# Patient Record
Sex: Female | Born: 1945
Health system: Southern US, Community
[De-identification: ages and names within clinical notes are randomized; demographics above are authoritative.]

---

## 2004-04-24 ENCOUNTER — Encounter: Admission: RE | Admit: 2004-04-24 | Discharge: 2004-04-24 | Payer: Self-pay | Admitting: Unknown Physician Specialty

## 2005-11-12 ENCOUNTER — Encounter: Admission: RE | Admit: 2005-11-12 | Discharge: 2005-11-12 | Payer: Self-pay | Admitting: Unknown Physician Specialty

## 2006-12-01 ENCOUNTER — Encounter: Admission: RE | Admit: 2006-12-01 | Discharge: 2006-12-01 | Payer: Self-pay | Admitting: Radiology

## 2006-12-06 ENCOUNTER — Encounter: Admission: RE | Admit: 2006-12-06 | Discharge: 2006-12-06 | Payer: Self-pay | Admitting: Unknown Physician Specialty

## 2008-07-24 ENCOUNTER — Encounter: Admission: RE | Admit: 2008-07-24 | Discharge: 2008-07-24 | Payer: Self-pay | Admitting: Unknown Physician Specialty

## 2009-12-19 ENCOUNTER — Encounter: Admission: RE | Admit: 2009-12-19 | Discharge: 2009-12-19 | Payer: Self-pay | Admitting: Internal Medicine

## 2009-12-23 ENCOUNTER — Encounter: Admission: RE | Admit: 2009-12-23 | Discharge: 2009-12-23 | Payer: Self-pay | Admitting: Unknown Physician Specialty

## 2010-02-15 ENCOUNTER — Encounter: Payer: Self-pay | Admitting: Unknown Physician Specialty

## 2010-03-27 ENCOUNTER — Other Ambulatory Visit: Payer: Self-pay | Admitting: Radiology

## 2010-03-27 DIAGNOSIS — Z0000 Encounter for general adult medical examination without abnormal findings: Secondary | ICD-10-CM

## 2010-05-19 ENCOUNTER — Other Ambulatory Visit: Payer: Self-pay | Admitting: Radiology

## 2010-05-19 DIAGNOSIS — Z0000 Encounter for general adult medical examination without abnormal findings: Secondary | ICD-10-CM

## 2010-05-20 ENCOUNTER — Other Ambulatory Visit: Payer: Self-pay | Admitting: Radiology

## 2010-05-20 DIAGNOSIS — Z0000 Encounter for general adult medical examination without abnormal findings: Secondary | ICD-10-CM

## 2010-11-18 ENCOUNTER — Other Ambulatory Visit: Payer: Self-pay | Admitting: Radiology

## 2010-11-18 DIAGNOSIS — Z1231 Encounter for screening mammogram for malignant neoplasm of breast: Secondary | ICD-10-CM

## 2011-01-29 ENCOUNTER — Other Ambulatory Visit: Payer: Self-pay | Admitting: Radiology

## 2011-01-29 DIAGNOSIS — R1030 Lower abdominal pain, unspecified: Secondary | ICD-10-CM

## 2011-01-29 DIAGNOSIS — C52 Malignant neoplasm of vagina: Secondary | ICD-10-CM

## 2011-02-04 ENCOUNTER — Ambulatory Visit
Admission: RE | Admit: 2011-02-04 | Discharge: 2011-02-04 | Disposition: A | Discharge status: Home / Self Care | Payer: PRIVATE HEALTH INSURANCE | Source: Ambulatory Visit | Attending: Radiology | Admitting: Radiology

## 2011-02-04 DIAGNOSIS — C52 Malignant neoplasm of vagina: Secondary | ICD-10-CM

## 2011-02-04 DIAGNOSIS — R1030 Lower abdominal pain, unspecified: Secondary | ICD-10-CM

## 2011-02-04 MED ORDER — IOHEXOL 300 MG/ML  SOLN
100.0000 mL | Freq: Once | INTRAMUSCULAR | Status: AC | PRN
  Administered 2011-02-04: 100 mL via INTRAVENOUS

## 2011-02-05 ENCOUNTER — Other Ambulatory Visit: Payer: Self-pay

## 2011-05-03 ENCOUNTER — Other Ambulatory Visit: Payer: Self-pay | Admitting: Radiology

## 2011-05-03 DIAGNOSIS — Z1231 Encounter for screening mammogram for malignant neoplasm of breast: Secondary | ICD-10-CM

## 2011-06-29 ENCOUNTER — Ambulatory Visit
Admission: RE | Admit: 2011-06-29 | Discharge: 2011-06-29 | Disposition: A | Discharge status: Home / Self Care | Payer: PRIVATE HEALTH INSURANCE | Source: Ambulatory Visit | Attending: Radiology | Admitting: Radiology

## 2011-06-29 DIAGNOSIS — Z1231 Encounter for screening mammogram for malignant neoplasm of breast: Secondary | ICD-10-CM

## 2011-07-02 ENCOUNTER — Other Ambulatory Visit: Payer: Self-pay | Admitting: Radiology

## 2011-07-02 ENCOUNTER — Ambulatory Visit
Admission: RE | Admit: 2011-07-02 | Discharge: 2011-07-02 | Disposition: A | Discharge status: Home / Self Care | Payer: PRIVATE HEALTH INSURANCE | Source: Ambulatory Visit | Attending: Radiology | Admitting: Radiology

## 2011-07-02 DIAGNOSIS — R928 Other abnormal and inconclusive findings on diagnostic imaging of breast: Secondary | ICD-10-CM

## 2011-10-27 ENCOUNTER — Other Ambulatory Visit: Payer: Self-pay | Admitting: Dermatology

## 2012-01-25 ENCOUNTER — Other Ambulatory Visit: Payer: Self-pay | Admitting: Radiology

## 2012-01-25 DIAGNOSIS — N63 Unspecified lump in unspecified breast: Secondary | ICD-10-CM

## 2012-01-31 ENCOUNTER — Ambulatory Visit
Admission: RE | Admit: 2012-01-31 | Discharge: 2012-01-31 | Disposition: A | Discharge status: Home / Self Care | Payer: PRIVATE HEALTH INSURANCE | Source: Ambulatory Visit | Attending: Radiology | Admitting: Radiology

## 2012-01-31 DIAGNOSIS — N63 Unspecified lump in unspecified breast: Secondary | ICD-10-CM

## 2012-08-09 ENCOUNTER — Ambulatory Visit
Admission: RE | Admit: 2012-08-09 | Discharge: 2012-08-09 | Disposition: A | Discharge status: Home / Self Care | Payer: Medicare Other | Source: Ambulatory Visit | Attending: Radiology | Admitting: Radiology

## 2012-08-09 ENCOUNTER — Other Ambulatory Visit: Payer: Self-pay | Admitting: Radiology

## 2012-08-09 DIAGNOSIS — N63 Unspecified lump in unspecified breast: Secondary | ICD-10-CM

## 2013-10-19 ENCOUNTER — Other Ambulatory Visit: Payer: Self-pay

## 2013-10-19 ENCOUNTER — Encounter (INDEPENDENT_AMBULATORY_CARE_PROVIDER_SITE_OTHER): Payer: Self-pay

## 2013-10-19 ENCOUNTER — Ambulatory Visit
Admission: RE | Admit: 2013-10-19 | Discharge: 2013-10-19 | Disposition: A | Discharge status: Home / Self Care | Payer: Medicare HMO | Source: Ambulatory Visit

## 2013-10-19 DIAGNOSIS — Z1231 Encounter for screening mammogram for malignant neoplasm of breast: Secondary | ICD-10-CM

## 2014-10-28 ENCOUNTER — Other Ambulatory Visit: Payer: Self-pay | Admitting: Internal Medicine

## 2014-10-28 DIAGNOSIS — Z1231 Encounter for screening mammogram for malignant neoplasm of breast: Secondary | ICD-10-CM

## 2015-01-28 DIAGNOSIS — I480 Paroxysmal atrial fibrillation: Secondary | ICD-10-CM | POA: Diagnosis not present

## 2015-01-28 DIAGNOSIS — Z7901 Long term (current) use of anticoagulants: Secondary | ICD-10-CM | POA: Diagnosis not present

## 2015-01-28 DIAGNOSIS — Z5181 Encounter for therapeutic drug level monitoring: Secondary | ICD-10-CM | POA: Diagnosis not present

## 2015-03-25 DIAGNOSIS — I480 Paroxysmal atrial fibrillation: Secondary | ICD-10-CM | POA: Diagnosis not present

## 2015-03-25 DIAGNOSIS — Z7901 Long term (current) use of anticoagulants: Secondary | ICD-10-CM | POA: Diagnosis not present

## 2015-03-25 DIAGNOSIS — R791 Abnormal coagulation profile: Secondary | ICD-10-CM | POA: Diagnosis not present

## 2015-03-25 DIAGNOSIS — Z5181 Encounter for therapeutic drug level monitoring: Secondary | ICD-10-CM | POA: Diagnosis not present

## 2015-04-02 DIAGNOSIS — H35032 Hypertensive retinopathy, left eye: Secondary | ICD-10-CM | POA: Diagnosis not present

## 2015-04-02 DIAGNOSIS — H35371 Puckering of macula, right eye: Secondary | ICD-10-CM | POA: Diagnosis not present

## 2015-04-02 DIAGNOSIS — I480 Paroxysmal atrial fibrillation: Secondary | ICD-10-CM | POA: Diagnosis not present

## 2015-04-02 DIAGNOSIS — H348312 Tributary (branch) retinal vein occlusion, right eye, stable: Secondary | ICD-10-CM | POA: Diagnosis not present

## 2015-04-02 DIAGNOSIS — Z5181 Encounter for therapeutic drug level monitoring: Secondary | ICD-10-CM | POA: Diagnosis not present

## 2015-04-02 DIAGNOSIS — H43813 Vitreous degeneration, bilateral: Secondary | ICD-10-CM | POA: Diagnosis not present

## 2015-04-02 DIAGNOSIS — Z7901 Long term (current) use of anticoagulants: Secondary | ICD-10-CM | POA: Diagnosis not present

## 2015-04-04 DIAGNOSIS — E785 Hyperlipidemia, unspecified: Secondary | ICD-10-CM | POA: Diagnosis not present

## 2015-04-04 DIAGNOSIS — I480 Paroxysmal atrial fibrillation: Secondary | ICD-10-CM | POA: Diagnosis not present

## 2015-04-04 DIAGNOSIS — I10 Essential (primary) hypertension: Secondary | ICD-10-CM | POA: Diagnosis not present

## 2015-04-11 ENCOUNTER — Other Ambulatory Visit: Payer: Self-pay | Admitting: Internal Medicine

## 2015-04-11 DIAGNOSIS — Z1231 Encounter for screening mammogram for malignant neoplasm of breast: Secondary | ICD-10-CM

## 2015-04-16 ENCOUNTER — Ambulatory Visit
Admission: RE | Admit: 2015-04-16 | Discharge: 2015-04-16 | Disposition: A | Discharge status: Home / Self Care | Payer: PPO | Source: Ambulatory Visit | Attending: Internal Medicine | Admitting: Internal Medicine

## 2015-04-16 DIAGNOSIS — Z1231 Encounter for screening mammogram for malignant neoplasm of breast: Secondary | ICD-10-CM | POA: Diagnosis not present

## 2015-05-08 DIAGNOSIS — Z5181 Encounter for therapeutic drug level monitoring: Secondary | ICD-10-CM | POA: Diagnosis not present

## 2015-05-08 DIAGNOSIS — I480 Paroxysmal atrial fibrillation: Secondary | ICD-10-CM | POA: Diagnosis not present

## 2015-05-08 DIAGNOSIS — R791 Abnormal coagulation profile: Secondary | ICD-10-CM | POA: Diagnosis not present

## 2015-05-08 DIAGNOSIS — Z7901 Long term (current) use of anticoagulants: Secondary | ICD-10-CM | POA: Diagnosis not present

## 2015-05-15 ENCOUNTER — Other Ambulatory Visit: Payer: Self-pay | Admitting: Internal Medicine

## 2015-05-16 ENCOUNTER — Other Ambulatory Visit: Payer: Self-pay | Admitting: Internal Medicine

## 2015-07-15 DIAGNOSIS — I480 Paroxysmal atrial fibrillation: Secondary | ICD-10-CM | POA: Diagnosis not present

## 2015-07-17 DIAGNOSIS — I480 Paroxysmal atrial fibrillation: Secondary | ICD-10-CM | POA: Diagnosis not present

## 2015-07-23 DIAGNOSIS — I10 Essential (primary) hypertension: Secondary | ICD-10-CM | POA: Diagnosis not present

## 2015-07-23 DIAGNOSIS — Z0000 Encounter for general adult medical examination without abnormal findings: Secondary | ICD-10-CM | POA: Diagnosis not present

## 2015-07-23 DIAGNOSIS — N6019 Diffuse cystic mastopathy of unspecified breast: Secondary | ICD-10-CM | POA: Diagnosis not present

## 2015-07-23 DIAGNOSIS — F439 Reaction to severe stress, unspecified: Secondary | ICD-10-CM | POA: Diagnosis not present

## 2015-07-23 DIAGNOSIS — Z119 Encounter for screening for infectious and parasitic diseases, unspecified: Secondary | ICD-10-CM | POA: Diagnosis not present

## 2015-07-23 DIAGNOSIS — R002 Palpitations: Secondary | ICD-10-CM | POA: Diagnosis not present

## 2015-07-23 DIAGNOSIS — E785 Hyperlipidemia, unspecified: Secondary | ICD-10-CM | POA: Diagnosis not present

## 2015-07-23 DIAGNOSIS — Z5181 Encounter for therapeutic drug level monitoring: Secondary | ICD-10-CM | POA: Diagnosis not present

## 2015-07-23 DIAGNOSIS — Z7901 Long term (current) use of anticoagulants: Secondary | ICD-10-CM | POA: Diagnosis not present

## 2015-07-23 DIAGNOSIS — I480 Paroxysmal atrial fibrillation: Secondary | ICD-10-CM | POA: Diagnosis not present

## 2015-07-23 DIAGNOSIS — E559 Vitamin D deficiency, unspecified: Secondary | ICD-10-CM | POA: Diagnosis not present

## 2015-07-23 DIAGNOSIS — M8580 Other specified disorders of bone density and structure, unspecified site: Secondary | ICD-10-CM | POA: Diagnosis not present

## 2015-08-04 DIAGNOSIS — R791 Abnormal coagulation profile: Secondary | ICD-10-CM | POA: Diagnosis not present

## 2015-08-04 DIAGNOSIS — I480 Paroxysmal atrial fibrillation: Secondary | ICD-10-CM | POA: Diagnosis not present

## 2015-08-04 DIAGNOSIS — Z5181 Encounter for therapeutic drug level monitoring: Secondary | ICD-10-CM | POA: Diagnosis not present

## 2015-08-04 DIAGNOSIS — Z7901 Long term (current) use of anticoagulants: Secondary | ICD-10-CM | POA: Diagnosis not present

## 2015-08-12 DIAGNOSIS — Z5181 Encounter for therapeutic drug level monitoring: Secondary | ICD-10-CM | POA: Diagnosis not present

## 2015-08-12 DIAGNOSIS — I480 Paroxysmal atrial fibrillation: Secondary | ICD-10-CM | POA: Diagnosis not present

## 2015-08-12 DIAGNOSIS — Z7901 Long term (current) use of anticoagulants: Secondary | ICD-10-CM | POA: Diagnosis not present

## 2015-10-22 DIAGNOSIS — H35032 Hypertensive retinopathy, left eye: Secondary | ICD-10-CM | POA: Diagnosis not present

## 2015-10-22 DIAGNOSIS — H348312 Tributary (branch) retinal vein occlusion, right eye, stable: Secondary | ICD-10-CM | POA: Diagnosis not present

## 2015-10-22 DIAGNOSIS — H43813 Vitreous degeneration, bilateral: Secondary | ICD-10-CM | POA: Diagnosis not present

## 2015-10-22 DIAGNOSIS — H35371 Puckering of macula, right eye: Secondary | ICD-10-CM | POA: Diagnosis not present

## 2015-11-05 DIAGNOSIS — Z5181 Encounter for therapeutic drug level monitoring: Secondary | ICD-10-CM | POA: Diagnosis not present

## 2015-11-05 DIAGNOSIS — E785 Hyperlipidemia, unspecified: Secondary | ICD-10-CM | POA: Diagnosis not present

## 2015-11-05 DIAGNOSIS — I480 Paroxysmal atrial fibrillation: Secondary | ICD-10-CM | POA: Diagnosis not present

## 2015-11-05 DIAGNOSIS — Z7901 Long term (current) use of anticoagulants: Secondary | ICD-10-CM | POA: Diagnosis not present

## 2015-11-05 DIAGNOSIS — R791 Abnormal coagulation profile: Secondary | ICD-10-CM | POA: Diagnosis not present

## 2015-11-05 DIAGNOSIS — I10 Essential (primary) hypertension: Secondary | ICD-10-CM | POA: Diagnosis not present

## 2015-12-01 DIAGNOSIS — Z5181 Encounter for therapeutic drug level monitoring: Secondary | ICD-10-CM | POA: Diagnosis not present

## 2015-12-01 DIAGNOSIS — I480 Paroxysmal atrial fibrillation: Secondary | ICD-10-CM | POA: Diagnosis not present

## 2015-12-01 DIAGNOSIS — Z7901 Long term (current) use of anticoagulants: Secondary | ICD-10-CM | POA: Diagnosis not present

## 2016-01-06 DIAGNOSIS — I480 Paroxysmal atrial fibrillation: Secondary | ICD-10-CM | POA: Diagnosis not present

## 2016-01-06 DIAGNOSIS — Z5181 Encounter for therapeutic drug level monitoring: Secondary | ICD-10-CM | POA: Diagnosis not present

## 2016-01-06 DIAGNOSIS — Z7901 Long term (current) use of anticoagulants: Secondary | ICD-10-CM | POA: Diagnosis not present

## 2016-02-02 DIAGNOSIS — Z7901 Long term (current) use of anticoagulants: Secondary | ICD-10-CM | POA: Diagnosis not present

## 2016-02-02 DIAGNOSIS — I480 Paroxysmal atrial fibrillation: Secondary | ICD-10-CM | POA: Diagnosis not present

## 2016-02-02 DIAGNOSIS — M8580 Other specified disorders of bone density and structure, unspecified site: Secondary | ICD-10-CM | POA: Diagnosis not present

## 2016-02-02 DIAGNOSIS — N6019 Diffuse cystic mastopathy of unspecified breast: Secondary | ICD-10-CM | POA: Diagnosis not present

## 2016-02-02 DIAGNOSIS — Z5181 Encounter for therapeutic drug level monitoring: Secondary | ICD-10-CM | POA: Diagnosis not present

## 2016-02-02 DIAGNOSIS — I10 Essential (primary) hypertension: Secondary | ICD-10-CM | POA: Diagnosis not present

## 2016-02-02 DIAGNOSIS — R002 Palpitations: Secondary | ICD-10-CM | POA: Diagnosis not present

## 2016-02-02 DIAGNOSIS — K5909 Other constipation: Secondary | ICD-10-CM | POA: Diagnosis not present

## 2016-02-02 DIAGNOSIS — G4700 Insomnia, unspecified: Secondary | ICD-10-CM | POA: Diagnosis not present

## 2016-02-02 DIAGNOSIS — R51 Headache: Secondary | ICD-10-CM | POA: Diagnosis not present

## 2016-02-02 DIAGNOSIS — F439 Reaction to severe stress, unspecified: Secondary | ICD-10-CM | POA: Diagnosis not present

## 2016-02-02 DIAGNOSIS — E785 Hyperlipidemia, unspecified: Secondary | ICD-10-CM | POA: Diagnosis not present

## 2016-02-02 DIAGNOSIS — N359 Urethral stricture, unspecified: Secondary | ICD-10-CM | POA: Diagnosis not present

## 2016-02-03 DIAGNOSIS — Z7901 Long term (current) use of anticoagulants: Secondary | ICD-10-CM | POA: Diagnosis not present

## 2016-02-03 DIAGNOSIS — Z5181 Encounter for therapeutic drug level monitoring: Secondary | ICD-10-CM | POA: Diagnosis not present

## 2016-02-03 DIAGNOSIS — I480 Paroxysmal atrial fibrillation: Secondary | ICD-10-CM | POA: Diagnosis not present

## 2016-03-02 DIAGNOSIS — I480 Paroxysmal atrial fibrillation: Secondary | ICD-10-CM | POA: Diagnosis not present

## 2016-03-02 DIAGNOSIS — Z7901 Long term (current) use of anticoagulants: Secondary | ICD-10-CM | POA: Diagnosis not present

## 2016-03-02 DIAGNOSIS — Z5181 Encounter for therapeutic drug level monitoring: Secondary | ICD-10-CM | POA: Diagnosis not present

## 2016-03-30 DIAGNOSIS — I480 Paroxysmal atrial fibrillation: Secondary | ICD-10-CM | POA: Diagnosis not present

## 2016-03-30 DIAGNOSIS — Z5181 Encounter for therapeutic drug level monitoring: Secondary | ICD-10-CM | POA: Diagnosis not present

## 2016-03-30 DIAGNOSIS — Z7901 Long term (current) use of anticoagulants: Secondary | ICD-10-CM | POA: Diagnosis not present

## 2016-04-27 DIAGNOSIS — Z7901 Long term (current) use of anticoagulants: Secondary | ICD-10-CM | POA: Diagnosis not present

## 2016-04-27 DIAGNOSIS — I480 Paroxysmal atrial fibrillation: Secondary | ICD-10-CM | POA: Diagnosis not present

## 2016-04-27 DIAGNOSIS — Z5181 Encounter for therapeutic drug level monitoring: Secondary | ICD-10-CM | POA: Diagnosis not present

## 2016-05-04 ENCOUNTER — Other Ambulatory Visit: Payer: Self-pay | Admitting: Internal Medicine

## 2016-05-04 DIAGNOSIS — Z1231 Encounter for screening mammogram for malignant neoplasm of breast: Secondary | ICD-10-CM

## 2016-05-05 ENCOUNTER — Ambulatory Visit
Admission: RE | Admit: 2016-05-05 | Discharge: 2016-05-05 | Disposition: A | Discharge status: Home / Self Care | Payer: PPO | Source: Ambulatory Visit | Attending: Internal Medicine | Admitting: Internal Medicine

## 2016-05-05 DIAGNOSIS — Z1231 Encounter for screening mammogram for malignant neoplasm of breast: Secondary | ICD-10-CM

## 2016-05-11 DIAGNOSIS — I10 Essential (primary) hypertension: Secondary | ICD-10-CM | POA: Diagnosis not present

## 2016-05-11 DIAGNOSIS — I480 Paroxysmal atrial fibrillation: Secondary | ICD-10-CM | POA: Diagnosis not present

## 2016-05-11 DIAGNOSIS — I4891 Unspecified atrial fibrillation: Secondary | ICD-10-CM | POA: Diagnosis not present

## 2016-07-07 DIAGNOSIS — Z7901 Long term (current) use of anticoagulants: Secondary | ICD-10-CM | POA: Diagnosis not present

## 2016-07-07 DIAGNOSIS — Z5181 Encounter for therapeutic drug level monitoring: Secondary | ICD-10-CM | POA: Diagnosis not present

## 2016-07-07 DIAGNOSIS — I480 Paroxysmal atrial fibrillation: Secondary | ICD-10-CM | POA: Diagnosis not present

## 2016-08-10 DIAGNOSIS — Z5181 Encounter for therapeutic drug level monitoring: Secondary | ICD-10-CM | POA: Diagnosis not present

## 2016-08-10 DIAGNOSIS — I480 Paroxysmal atrial fibrillation: Secondary | ICD-10-CM | POA: Diagnosis not present

## 2016-08-10 DIAGNOSIS — Z7901 Long term (current) use of anticoagulants: Secondary | ICD-10-CM | POA: Diagnosis not present

## 2016-09-21 DIAGNOSIS — Z5181 Encounter for therapeutic drug level monitoring: Secondary | ICD-10-CM | POA: Diagnosis not present

## 2016-09-21 DIAGNOSIS — I480 Paroxysmal atrial fibrillation: Secondary | ICD-10-CM | POA: Diagnosis not present

## 2016-09-21 DIAGNOSIS — Z7901 Long term (current) use of anticoagulants: Secondary | ICD-10-CM | POA: Diagnosis not present

## 2016-09-28 DIAGNOSIS — M25551 Pain in right hip: Secondary | ICD-10-CM | POA: Diagnosis not present

## 2016-09-28 DIAGNOSIS — M5136 Other intervertebral disc degeneration, lumbar region: Secondary | ICD-10-CM | POA: Diagnosis not present

## 2016-09-28 DIAGNOSIS — M549 Dorsalgia, unspecified: Secondary | ICD-10-CM | POA: Diagnosis not present

## 2016-09-28 DIAGNOSIS — E785 Hyperlipidemia, unspecified: Secondary | ICD-10-CM | POA: Diagnosis not present

## 2016-09-28 DIAGNOSIS — I10 Essential (primary) hypertension: Secondary | ICD-10-CM | POA: Diagnosis not present

## 2016-09-28 DIAGNOSIS — R51 Headache: Secondary | ICD-10-CM | POA: Diagnosis not present

## 2016-09-28 DIAGNOSIS — M47896 Other spondylosis, lumbar region: Secondary | ICD-10-CM | POA: Diagnosis not present

## 2016-10-12 DIAGNOSIS — R8299 Other abnormal findings in urine: Secondary | ICD-10-CM | POA: Diagnosis not present

## 2016-10-12 DIAGNOSIS — K5909 Other constipation: Secondary | ICD-10-CM | POA: Diagnosis not present

## 2016-10-12 DIAGNOSIS — E785 Hyperlipidemia, unspecified: Secondary | ICD-10-CM | POA: Diagnosis not present

## 2016-10-12 DIAGNOSIS — Z7901 Long term (current) use of anticoagulants: Secondary | ICD-10-CM | POA: Diagnosis not present

## 2016-10-12 DIAGNOSIS — N359 Urethral stricture, unspecified: Secondary | ICD-10-CM | POA: Diagnosis not present

## 2016-10-12 DIAGNOSIS — R5383 Other fatigue: Secondary | ICD-10-CM | POA: Diagnosis not present

## 2016-10-12 DIAGNOSIS — J209 Acute bronchitis, unspecified: Secondary | ICD-10-CM | POA: Diagnosis not present

## 2016-10-12 DIAGNOSIS — Z5181 Encounter for therapeutic drug level monitoring: Secondary | ICD-10-CM | POA: Diagnosis not present

## 2016-10-27 DIAGNOSIS — Z5181 Encounter for therapeutic drug level monitoring: Secondary | ICD-10-CM | POA: Diagnosis not present

## 2016-10-27 DIAGNOSIS — I480 Paroxysmal atrial fibrillation: Secondary | ICD-10-CM | POA: Diagnosis not present

## 2016-10-27 DIAGNOSIS — Z7901 Long term (current) use of anticoagulants: Secondary | ICD-10-CM | POA: Diagnosis not present

## 2016-11-11 DIAGNOSIS — Z5181 Encounter for therapeutic drug level monitoring: Secondary | ICD-10-CM | POA: Diagnosis not present

## 2016-11-11 DIAGNOSIS — Z7901 Long term (current) use of anticoagulants: Secondary | ICD-10-CM | POA: Diagnosis not present

## 2016-11-11 DIAGNOSIS — I480 Paroxysmal atrial fibrillation: Secondary | ICD-10-CM | POA: Diagnosis not present

## 2016-11-17 DIAGNOSIS — M489 Spondylopathy, unspecified: Secondary | ICD-10-CM | POA: Diagnosis not present

## 2016-11-17 DIAGNOSIS — K5909 Other constipation: Secondary | ICD-10-CM | POA: Diagnosis not present

## 2016-11-17 DIAGNOSIS — I10 Essential (primary) hypertension: Secondary | ICD-10-CM | POA: Diagnosis not present

## 2016-11-17 DIAGNOSIS — I480 Paroxysmal atrial fibrillation: Secondary | ICD-10-CM | POA: Diagnosis not present

## 2016-11-17 DIAGNOSIS — R002 Palpitations: Secondary | ICD-10-CM | POA: Diagnosis not present

## 2016-11-17 DIAGNOSIS — Z7901 Long term (current) use of anticoagulants: Secondary | ICD-10-CM | POA: Diagnosis not present

## 2016-11-17 DIAGNOSIS — Z5181 Encounter for therapeutic drug level monitoring: Secondary | ICD-10-CM | POA: Diagnosis not present

## 2016-11-17 DIAGNOSIS — F1320 Sedative, hypnotic or anxiolytic dependence, uncomplicated: Secondary | ICD-10-CM | POA: Diagnosis not present

## 2016-11-17 DIAGNOSIS — Q6431 Congenital bladder neck obstruction: Secondary | ICD-10-CM | POA: Diagnosis not present

## 2016-11-17 DIAGNOSIS — Z0000 Encounter for general adult medical examination without abnormal findings: Secondary | ICD-10-CM | POA: Diagnosis not present

## 2016-11-17 DIAGNOSIS — Z23 Encounter for immunization: Secondary | ICD-10-CM | POA: Diagnosis not present

## 2016-11-17 DIAGNOSIS — E785 Hyperlipidemia, unspecified: Secondary | ICD-10-CM | POA: Diagnosis not present

## 2017-02-15 DIAGNOSIS — Z7901 Long term (current) use of anticoagulants: Secondary | ICD-10-CM | POA: Diagnosis not present

## 2017-02-15 DIAGNOSIS — Z5181 Encounter for therapeutic drug level monitoring: Secondary | ICD-10-CM | POA: Diagnosis not present

## 2017-02-15 DIAGNOSIS — I480 Paroxysmal atrial fibrillation: Secondary | ICD-10-CM | POA: Diagnosis not present

## 2017-02-22 DIAGNOSIS — Z5181 Encounter for therapeutic drug level monitoring: Secondary | ICD-10-CM | POA: Diagnosis not present

## 2017-02-22 DIAGNOSIS — M489 Spondylopathy, unspecified: Secondary | ICD-10-CM | POA: Diagnosis not present

## 2017-02-22 DIAGNOSIS — Z1211 Encounter for screening for malignant neoplasm of colon: Secondary | ICD-10-CM | POA: Diagnosis not present

## 2017-02-22 DIAGNOSIS — R002 Palpitations: Secondary | ICD-10-CM | POA: Diagnosis not present

## 2017-02-22 DIAGNOSIS — G4700 Insomnia, unspecified: Secondary | ICD-10-CM | POA: Diagnosis not present

## 2017-02-22 DIAGNOSIS — F439 Reaction to severe stress, unspecified: Secondary | ICD-10-CM | POA: Diagnosis not present

## 2017-02-22 DIAGNOSIS — K5909 Other constipation: Secondary | ICD-10-CM | POA: Diagnosis not present

## 2017-02-22 DIAGNOSIS — I480 Paroxysmal atrial fibrillation: Secondary | ICD-10-CM | POA: Diagnosis not present

## 2017-02-22 DIAGNOSIS — Q6431 Congenital bladder neck obstruction: Secondary | ICD-10-CM | POA: Diagnosis not present

## 2017-02-22 DIAGNOSIS — F1320 Sedative, hypnotic or anxiolytic dependence, uncomplicated: Secondary | ICD-10-CM | POA: Diagnosis not present

## 2017-02-22 DIAGNOSIS — I10 Essential (primary) hypertension: Secondary | ICD-10-CM | POA: Diagnosis not present

## 2017-02-22 DIAGNOSIS — E785 Hyperlipidemia, unspecified: Secondary | ICD-10-CM | POA: Diagnosis not present

## 2017-03-22 DIAGNOSIS — I480 Paroxysmal atrial fibrillation: Secondary | ICD-10-CM | POA: Diagnosis not present

## 2017-03-22 DIAGNOSIS — Z7901 Long term (current) use of anticoagulants: Secondary | ICD-10-CM | POA: Diagnosis not present

## 2017-03-22 DIAGNOSIS — R791 Abnormal coagulation profile: Secondary | ICD-10-CM | POA: Diagnosis not present

## 2017-03-22 DIAGNOSIS — Z5181 Encounter for therapeutic drug level monitoring: Secondary | ICD-10-CM | POA: Diagnosis not present

## 2017-03-30 DIAGNOSIS — H527 Unspecified disorder of refraction: Secondary | ICD-10-CM | POA: Diagnosis not present

## 2017-03-30 DIAGNOSIS — H25012 Cortical age-related cataract, left eye: Secondary | ICD-10-CM | POA: Diagnosis not present

## 2017-04-06 DIAGNOSIS — H35341 Macular cyst, hole, or pseudohole, right eye: Secondary | ICD-10-CM | POA: Diagnosis not present

## 2017-04-06 DIAGNOSIS — H43392 Other vitreous opacities, left eye: Secondary | ICD-10-CM | POA: Diagnosis not present

## 2017-04-06 DIAGNOSIS — H25012 Cortical age-related cataract, left eye: Secondary | ICD-10-CM | POA: Diagnosis not present

## 2017-04-12 DIAGNOSIS — I480 Paroxysmal atrial fibrillation: Secondary | ICD-10-CM | POA: Diagnosis not present

## 2017-04-12 DIAGNOSIS — Z7901 Long term (current) use of anticoagulants: Secondary | ICD-10-CM | POA: Diagnosis not present

## 2017-04-12 DIAGNOSIS — Z5181 Encounter for therapeutic drug level monitoring: Secondary | ICD-10-CM | POA: Diagnosis not present

## 2017-04-13 DIAGNOSIS — H43392 Other vitreous opacities, left eye: Secondary | ICD-10-CM | POA: Diagnosis not present

## 2017-04-13 DIAGNOSIS — H35341 Macular cyst, hole, or pseudohole, right eye: Secondary | ICD-10-CM | POA: Diagnosis not present

## 2017-04-13 DIAGNOSIS — H25012 Cortical age-related cataract, left eye: Secondary | ICD-10-CM | POA: Diagnosis not present

## 2017-05-25 DIAGNOSIS — I480 Paroxysmal atrial fibrillation: Secondary | ICD-10-CM | POA: Diagnosis not present

## 2017-05-25 DIAGNOSIS — Z7901 Long term (current) use of anticoagulants: Secondary | ICD-10-CM | POA: Diagnosis not present

## 2017-05-25 DIAGNOSIS — R791 Abnormal coagulation profile: Secondary | ICD-10-CM | POA: Diagnosis not present

## 2017-05-25 DIAGNOSIS — Z5181 Encounter for therapeutic drug level monitoring: Secondary | ICD-10-CM | POA: Diagnosis not present

## 2017-06-22 DIAGNOSIS — I480 Paroxysmal atrial fibrillation: Secondary | ICD-10-CM | POA: Diagnosis not present

## 2017-06-22 DIAGNOSIS — Z5181 Encounter for therapeutic drug level monitoring: Secondary | ICD-10-CM | POA: Diagnosis not present

## 2017-06-22 DIAGNOSIS — R791 Abnormal coagulation profile: Secondary | ICD-10-CM | POA: Diagnosis not present

## 2017-06-22 DIAGNOSIS — Z7901 Long term (current) use of anticoagulants: Secondary | ICD-10-CM | POA: Diagnosis not present

## 2017-07-11 DIAGNOSIS — Z7901 Long term (current) use of anticoagulants: Secondary | ICD-10-CM | POA: Diagnosis not present

## 2017-07-11 DIAGNOSIS — Z5181 Encounter for therapeutic drug level monitoring: Secondary | ICD-10-CM | POA: Diagnosis not present

## 2017-07-11 DIAGNOSIS — I480 Paroxysmal atrial fibrillation: Secondary | ICD-10-CM | POA: Diagnosis not present

## 2017-07-19 IMAGING — MG 2D DIGITAL SCREENING BILATERAL MAMMOGRAM WITH CAD AND ADJUNCT TO
8 of 12 series · 8 of 28 positions shown · non-contrast
Comparison: Previous exam(s).

CLINICAL DATA: Screening.

EXAM:
2D DIGITAL SCREENING BILATERAL MAMMOGRAM WITH CAD AND ADJUNCT TOMO

[R MLO synth-2D]
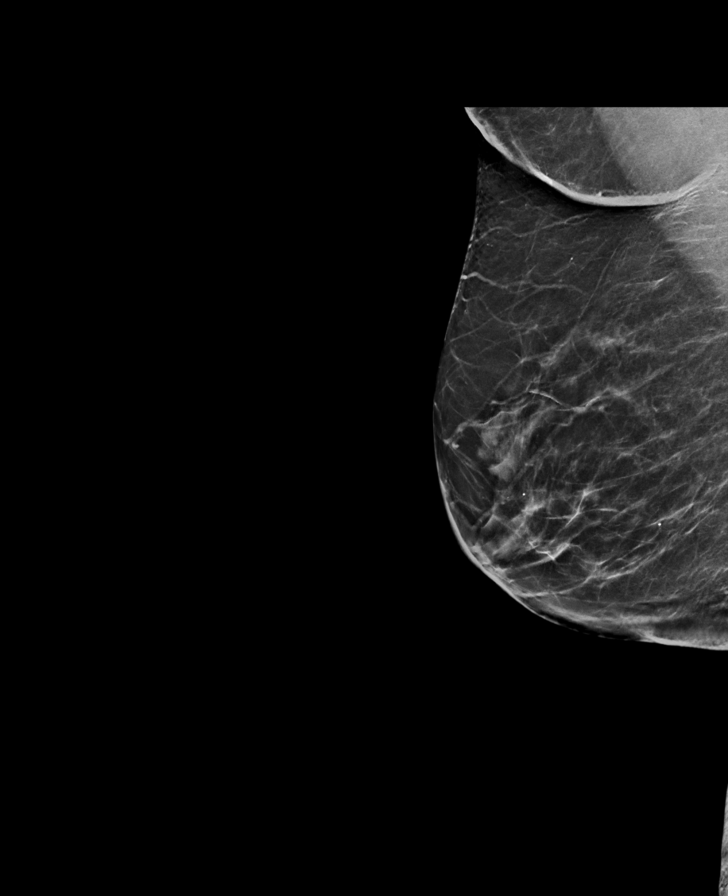

[L MLO synth-2D]
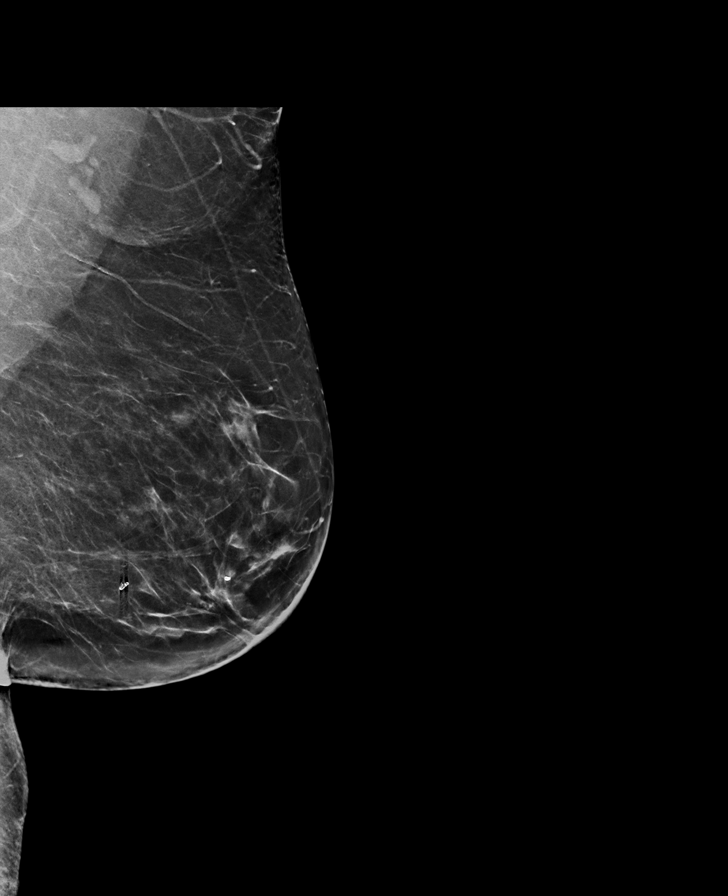

[R CC synth-2D]
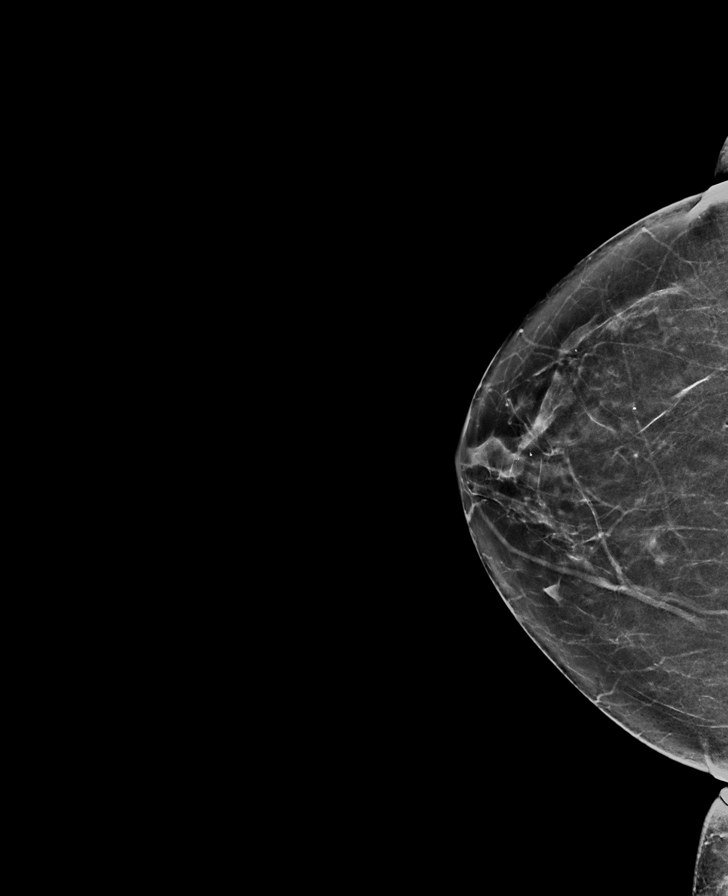

[R MLO]
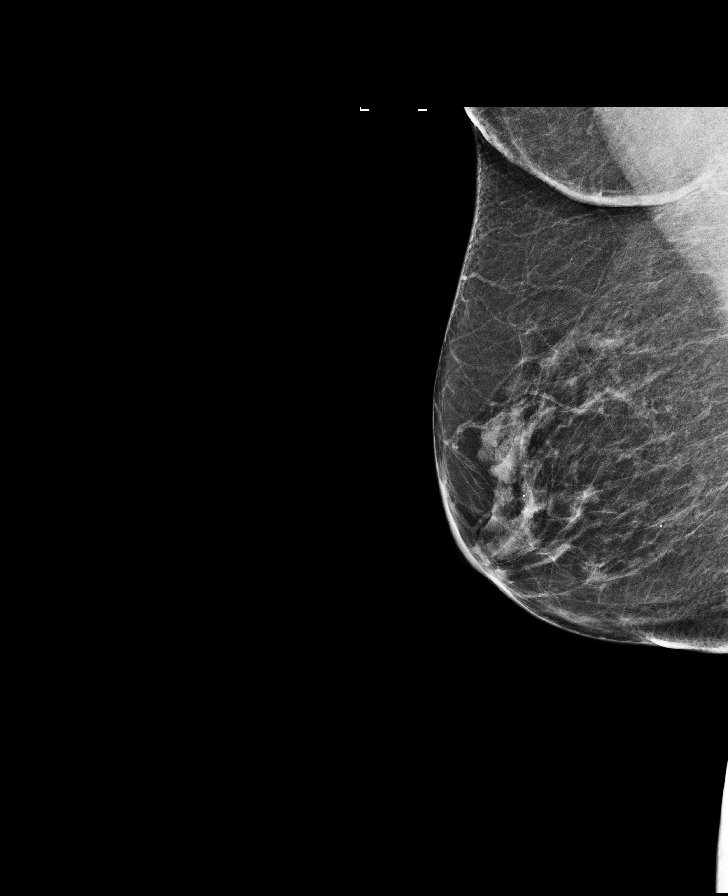

[L CC synth-2D]
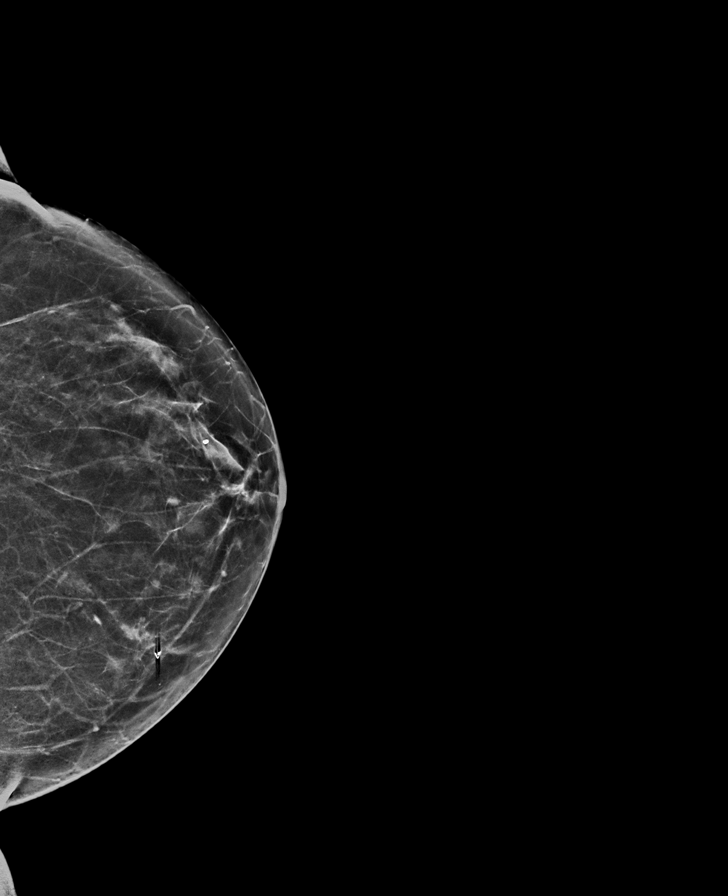

[L MLO]
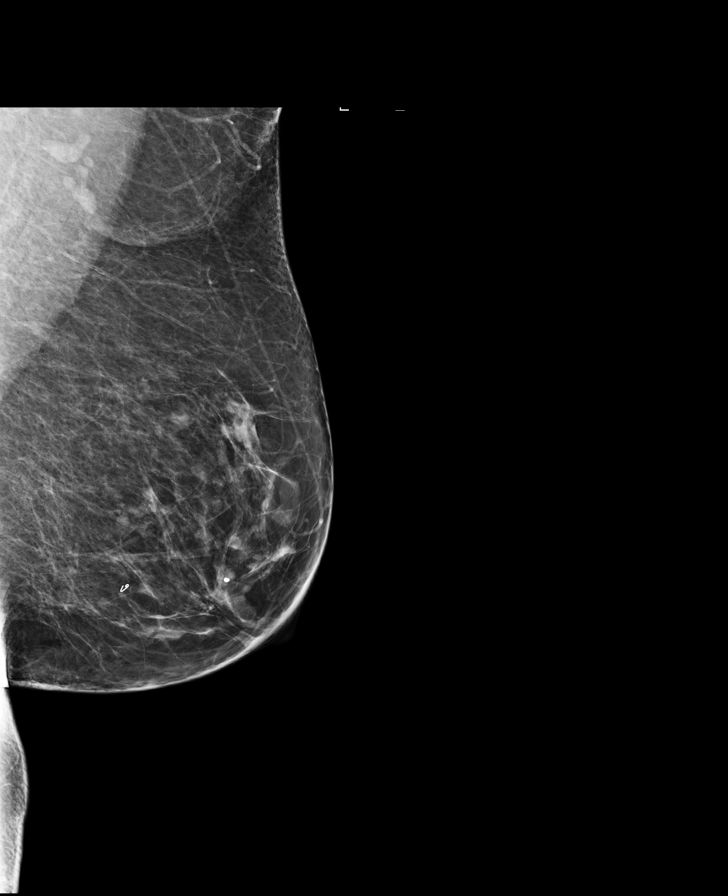

[R CC]
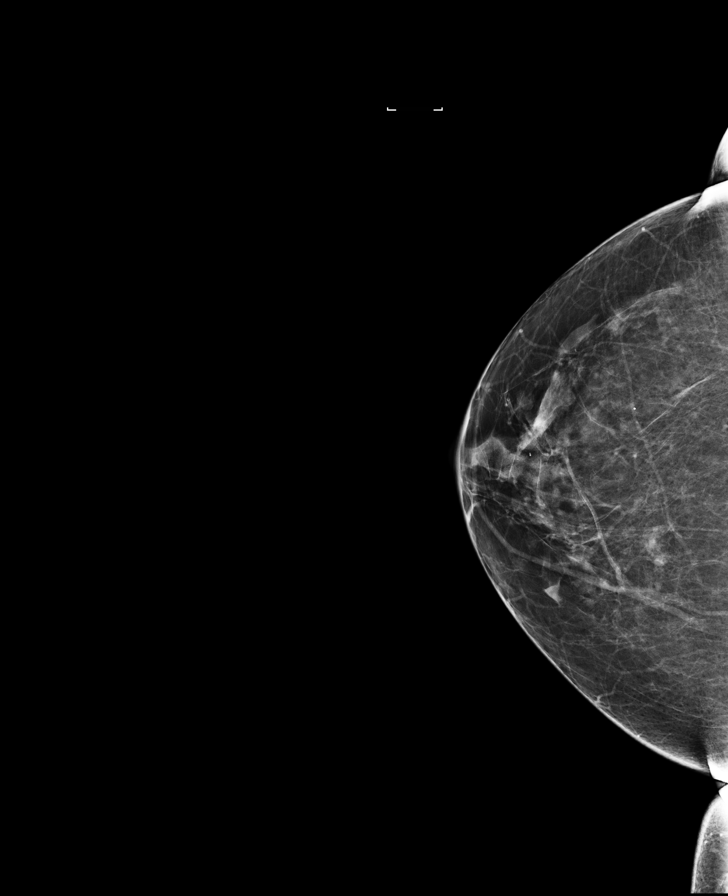

[L CC]
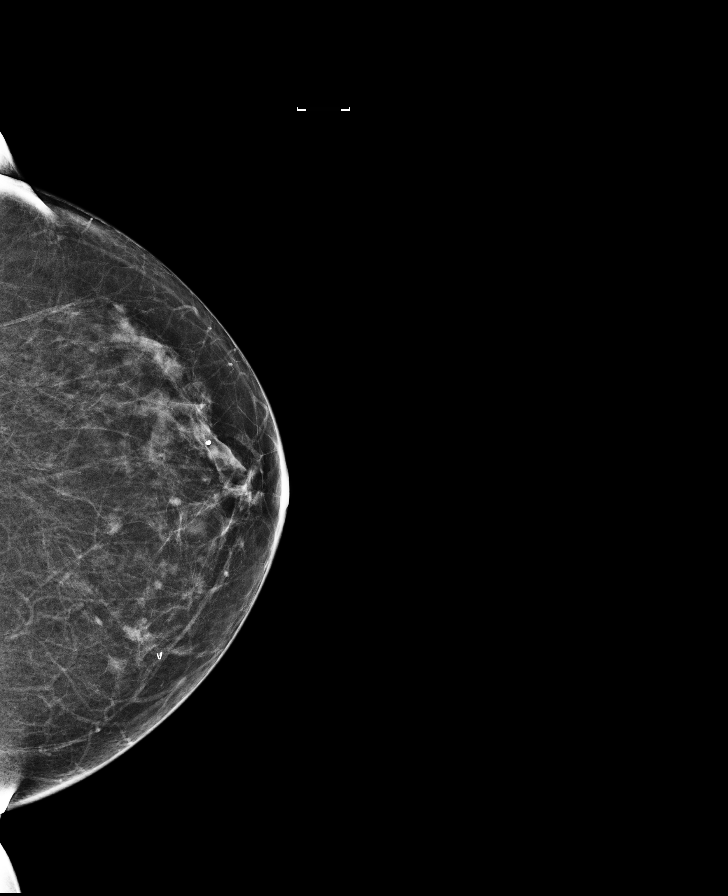

[8 of 28 positions shown; findings below may reference images not displayed]

ACR Breast Density Category b: There are scattered areas of
fibroglandular density.
FINDINGS: There are no findings suspicious for malignancy. Images were
processed with CAD.
IMPRESSION: No mammographic evidence of malignancy. A result letter of this
screening mammogram will be mailed directly to the patient.

RECOMMENDATION:
Screening mammogram in one year. (Code:97-6-RS4)

BI-RADS CATEGORY  1: Negative.

## 2017-08-02 DIAGNOSIS — Z7901 Long term (current) use of anticoagulants: Secondary | ICD-10-CM | POA: Diagnosis not present

## 2017-08-02 DIAGNOSIS — I480 Paroxysmal atrial fibrillation: Secondary | ICD-10-CM | POA: Diagnosis not present

## 2017-08-05 DIAGNOSIS — N3000 Acute cystitis without hematuria: Secondary | ICD-10-CM | POA: Diagnosis not present

## 2017-08-11 DIAGNOSIS — R791 Abnormal coagulation profile: Secondary | ICD-10-CM | POA: Diagnosis not present

## 2017-08-11 DIAGNOSIS — Z5181 Encounter for therapeutic drug level monitoring: Secondary | ICD-10-CM | POA: Diagnosis not present

## 2017-08-11 DIAGNOSIS — I480 Paroxysmal atrial fibrillation: Secondary | ICD-10-CM | POA: Diagnosis not present

## 2017-08-11 DIAGNOSIS — Z7901 Long term (current) use of anticoagulants: Secondary | ICD-10-CM | POA: Diagnosis not present

## 2017-08-19 DIAGNOSIS — R791 Abnormal coagulation profile: Secondary | ICD-10-CM | POA: Diagnosis not present

## 2017-08-19 DIAGNOSIS — I480 Paroxysmal atrial fibrillation: Secondary | ICD-10-CM | POA: Diagnosis not present

## 2017-08-19 DIAGNOSIS — Z5181 Encounter for therapeutic drug level monitoring: Secondary | ICD-10-CM | POA: Diagnosis not present

## 2017-08-19 DIAGNOSIS — Z7901 Long term (current) use of anticoagulants: Secondary | ICD-10-CM | POA: Diagnosis not present

## 2017-08-23 DIAGNOSIS — R82998 Other abnormal findings in urine: Secondary | ICD-10-CM | POA: Diagnosis not present

## 2017-08-23 DIAGNOSIS — F1320 Sedative, hypnotic or anxiolytic dependence, uncomplicated: Secondary | ICD-10-CM | POA: Diagnosis not present

## 2017-08-23 DIAGNOSIS — R631 Polydipsia: Secondary | ICD-10-CM | POA: Diagnosis not present

## 2017-08-23 DIAGNOSIS — I480 Paroxysmal atrial fibrillation: Secondary | ICD-10-CM | POA: Diagnosis not present

## 2017-08-23 DIAGNOSIS — I10 Essential (primary) hypertension: Secondary | ICD-10-CM | POA: Diagnosis not present

## 2017-08-23 DIAGNOSIS — F439 Reaction to severe stress, unspecified: Secondary | ICD-10-CM | POA: Diagnosis not present

## 2017-08-23 DIAGNOSIS — R102 Pelvic and perineal pain: Secondary | ICD-10-CM | POA: Diagnosis not present

## 2017-08-23 DIAGNOSIS — N3001 Acute cystitis with hematuria: Secondary | ICD-10-CM | POA: Diagnosis not present

## 2017-08-23 DIAGNOSIS — Z7901 Long term (current) use of anticoagulants: Secondary | ICD-10-CM | POA: Diagnosis not present

## 2017-08-23 DIAGNOSIS — N390 Urinary tract infection, site not specified: Secondary | ICD-10-CM | POA: Diagnosis not present

## 2017-08-23 DIAGNOSIS — M489 Spondylopathy, unspecified: Secondary | ICD-10-CM | POA: Diagnosis not present

## 2017-08-23 DIAGNOSIS — E785 Hyperlipidemia, unspecified: Secondary | ICD-10-CM | POA: Diagnosis not present

## 2017-08-25 DIAGNOSIS — R7303 Prediabetes: Secondary | ICD-10-CM | POA: Diagnosis not present

## 2017-08-25 DIAGNOSIS — N952 Postmenopausal atrophic vaginitis: Secondary | ICD-10-CM | POA: Diagnosis not present

## 2017-08-25 DIAGNOSIS — N8110 Cystocele, unspecified: Secondary | ICD-10-CM | POA: Diagnosis not present

## 2017-08-25 DIAGNOSIS — I4891 Unspecified atrial fibrillation: Secondary | ICD-10-CM | POA: Diagnosis not present

## 2017-08-25 DIAGNOSIS — R339 Retention of urine, unspecified: Secondary | ICD-10-CM | POA: Diagnosis not present

## 2017-08-25 DIAGNOSIS — Z7901 Long term (current) use of anticoagulants: Secondary | ICD-10-CM | POA: Diagnosis not present

## 2017-08-25 DIAGNOSIS — R32 Unspecified urinary incontinence: Secondary | ICD-10-CM | POA: Diagnosis not present

## 2017-08-25 DIAGNOSIS — N3944 Nocturnal enuresis: Secondary | ICD-10-CM | POA: Diagnosis not present

## 2017-09-01 DIAGNOSIS — R791 Abnormal coagulation profile: Secondary | ICD-10-CM | POA: Diagnosis not present

## 2017-09-01 DIAGNOSIS — N390 Urinary tract infection, site not specified: Secondary | ICD-10-CM | POA: Diagnosis not present

## 2017-09-01 DIAGNOSIS — I480 Paroxysmal atrial fibrillation: Secondary | ICD-10-CM | POA: Diagnosis not present

## 2017-09-01 DIAGNOSIS — Z7901 Long term (current) use of anticoagulants: Secondary | ICD-10-CM | POA: Diagnosis not present

## 2017-09-01 DIAGNOSIS — Z5181 Encounter for therapeutic drug level monitoring: Secondary | ICD-10-CM | POA: Diagnosis not present

## 2017-09-12 DIAGNOSIS — N320 Bladder-neck obstruction: Secondary | ICD-10-CM | POA: Diagnosis not present

## 2017-09-12 DIAGNOSIS — E876 Hypokalemia: Secondary | ICD-10-CM | POA: Diagnosis not present

## 2017-09-12 DIAGNOSIS — Z90710 Acquired absence of both cervix and uterus: Secondary | ICD-10-CM | POA: Diagnosis not present

## 2017-09-12 DIAGNOSIS — N1339 Other hydronephrosis: Secondary | ICD-10-CM | POA: Diagnosis not present

## 2017-09-12 DIAGNOSIS — N1330 Unspecified hydronephrosis: Secondary | ICD-10-CM | POA: Diagnosis not present

## 2017-09-12 DIAGNOSIS — N136 Pyonephrosis: Secondary | ICD-10-CM | POA: Diagnosis not present

## 2017-09-12 DIAGNOSIS — Z8742 Personal history of other diseases of the female genital tract: Secondary | ICD-10-CM | POA: Diagnosis not present

## 2017-09-12 DIAGNOSIS — R339 Retention of urine, unspecified: Secondary | ICD-10-CM | POA: Diagnosis not present

## 2017-09-12 DIAGNOSIS — I480 Paroxysmal atrial fibrillation: Secondary | ICD-10-CM | POA: Diagnosis not present

## 2017-09-12 DIAGNOSIS — R109 Unspecified abdominal pain: Secondary | ICD-10-CM | POA: Diagnosis not present

## 2017-09-12 DIAGNOSIS — R319 Hematuria, unspecified: Secondary | ICD-10-CM | POA: Diagnosis not present

## 2017-09-12 DIAGNOSIS — B951 Streptococcus, group B, as the cause of diseases classified elsewhere: Secondary | ICD-10-CM | POA: Diagnosis not present

## 2017-09-12 DIAGNOSIS — N390 Urinary tract infection, site not specified: Secondary | ICD-10-CM | POA: Diagnosis not present

## 2017-09-12 DIAGNOSIS — E785 Hyperlipidemia, unspecified: Secondary | ICD-10-CM | POA: Diagnosis not present

## 2017-09-12 DIAGNOSIS — R32 Unspecified urinary incontinence: Secondary | ICD-10-CM | POA: Diagnosis not present

## 2017-09-12 DIAGNOSIS — Z79899 Other long term (current) drug therapy: Secondary | ICD-10-CM | POA: Diagnosis not present

## 2017-09-12 DIAGNOSIS — Z960 Presence of urogenital implants: Secondary | ICD-10-CM | POA: Diagnosis not present

## 2017-09-12 DIAGNOSIS — R7303 Prediabetes: Secondary | ICD-10-CM | POA: Diagnosis not present

## 2017-09-12 DIAGNOSIS — Z8544 Personal history of malignant neoplasm of other female genital organs: Secondary | ICD-10-CM | POA: Diagnosis not present

## 2017-09-12 DIAGNOSIS — Z87448 Personal history of other diseases of urinary system: Secondary | ICD-10-CM | POA: Diagnosis not present

## 2017-09-12 DIAGNOSIS — N179 Acute kidney failure, unspecified: Secondary | ICD-10-CM | POA: Diagnosis not present

## 2017-09-12 DIAGNOSIS — I10 Essential (primary) hypertension: Secondary | ICD-10-CM | POA: Diagnosis not present

## 2017-09-12 DIAGNOSIS — Z923 Personal history of irradiation: Secondary | ICD-10-CM | POA: Diagnosis not present

## 2017-09-12 DIAGNOSIS — Z7901 Long term (current) use of anticoagulants: Secondary | ICD-10-CM | POA: Diagnosis not present

## 2017-09-16 DIAGNOSIS — N8110 Cystocele, unspecified: Secondary | ICD-10-CM | POA: Diagnosis not present

## 2017-09-19 ENCOUNTER — Other Ambulatory Visit: Payer: Self-pay

## 2017-09-19 DIAGNOSIS — R32 Unspecified urinary incontinence: Secondary | ICD-10-CM | POA: Diagnosis not present

## 2017-09-19 DIAGNOSIS — R310 Gross hematuria: Secondary | ICD-10-CM | POA: Diagnosis not present

## 2017-09-19 DIAGNOSIS — N8110 Cystocele, unspecified: Secondary | ICD-10-CM | POA: Diagnosis not present

## 2017-09-19 DIAGNOSIS — N390 Urinary tract infection, site not specified: Secondary | ICD-10-CM | POA: Diagnosis not present

## 2017-09-19 DIAGNOSIS — R319 Hematuria, unspecified: Secondary | ICD-10-CM | POA: Diagnosis not present

## 2017-09-19 DIAGNOSIS — N3289 Other specified disorders of bladder: Secondary | ICD-10-CM | POA: Diagnosis not present

## 2017-09-19 NOTE — Patient Outreach (Signed)
[facility]) Care Management  09/19/2017  Darlene Baxter May 30, 1945 [id number]   Referral Date: 09/19/17 Referral Source: HTA report Date of Admission: unknown Diagnosis: Unknown Date of Discharge: 09/15/17 Facility:  [facility] Insurance:  HTA  Outreach attempt: Tried several times to reach patient each time call is busy.    Plan: RN CM will send letter and attempt patient again within 4 business days.     Darlene Baseman, RN, MSN [facility] Care Management Care Management Coordinator Direct Line [phone] Toll Free: [phone]  Fax: [phone]

## 2017-09-20 ENCOUNTER — Other Ambulatory Visit: Payer: Self-pay

## 2017-09-20 NOTE — Patient Outreach (Signed)
[facility]) Care Management  09/20/2017  Darlene Baxter Apr 18, 1945 [id number]   Referral Date: 09/19/17 Referral Source: HTA report Date of Admission: 8.19.19 Diagnosis: UTI/urinary retention Date of Discharge: 09/15/17 Facility:  [facility] Insurance:  HTA  Outreach Attempt: spoke with patient. She is able to verify HIPAA.  Patient reports that she is doing ok at this time. She reports however that her mobility has been limited due to coming home with a foley catheter.  Patient reports that she saw the urologist on yesterday and catheter was left in place as she still has an infection.  She states she was placed on Cipro.  She reports that she has a follow up appointment with the urologist on next Tuesday.  Discussed with patient signs of infection and importance of notifying the physician.  She verbalized understanding.   Patient lives in the home with her brother and her son comes by to check on her.  Patient states that she is independent with all aspect of care and has transportation to appointments.   Patient unable to review medications on call.    Discussed [facility] services.  Patient declined needs presently.  Plan: RN CM will close case.  Jone Baseman, RN, MSN [facility] Care Management Coordinator Direct Line [phone] Cell [phone] Toll Free: [phone]  Fax: [phone]

## 2017-09-21 ENCOUNTER — Ambulatory Visit: Payer: PPO

## 2017-09-22 DIAGNOSIS — I480 Paroxysmal atrial fibrillation: Secondary | ICD-10-CM | POA: Diagnosis not present

## 2017-09-22 DIAGNOSIS — Z5181 Encounter for therapeutic drug level monitoring: Secondary | ICD-10-CM | POA: Diagnosis not present

## 2017-09-22 DIAGNOSIS — Z7901 Long term (current) use of anticoagulants: Secondary | ICD-10-CM | POA: Diagnosis not present

## 2017-09-27 DIAGNOSIS — N8110 Cystocele, unspecified: Secondary | ICD-10-CM | POA: Diagnosis not present

## 2017-09-27 DIAGNOSIS — R32 Unspecified urinary incontinence: Secondary | ICD-10-CM | POA: Diagnosis not present

## 2017-10-04 ENCOUNTER — Other Ambulatory Visit: Payer: Self-pay | Admitting: Pharmacist

## 2017-10-04 DIAGNOSIS — R791 Abnormal coagulation profile: Secondary | ICD-10-CM | POA: Diagnosis not present

## 2017-10-04 DIAGNOSIS — I480 Paroxysmal atrial fibrillation: Secondary | ICD-10-CM | POA: Diagnosis not present

## 2017-10-04 DIAGNOSIS — Z7901 Long term (current) use of anticoagulants: Secondary | ICD-10-CM | POA: Diagnosis not present

## 2017-10-04 NOTE — Patient Outreach (Signed)
[facility]) Care Management  10/04/2017   Darlene Baxter Oct 07, 1945 [id number]  Subjective:   72 year old female referred to [facility] for 30 day post discharge medication review.  PMHx includes, but not limited to, HTN, hyperlipidemia, atrial fibrillation, urinary frequency.  Contacted patient s/p discharge to review medications. She states that she is doing well after discharge. She denies having any questions or concerns about any of her medications.   Objective:   Current Medications:  Current Outpatient Medications  Medication Sig Dispense Refill  . ALPRAZolam (XANAX) 0.25 MG tablet Take 0.25 mg by mouth at bedtime.  2  . amLODipine (NORVASC) 10 MG tablet Take 10 mg by mouth daily.    Marland Kitchen atenolol (TENORMIN) 100 MG tablet Take 100 mg by mouth every evening.  1  . cholecalciferol (VITAMIN D) 1000 units tablet Take 1,000 Units by mouth daily.    Marland Kitchen losartan (COZAAR) 100 MG tablet Take 100 mg by mouth daily.  1  . Magnesium Oxide, Antacid, 500 MG CAPS Take 500 mg by mouth daily.    . Multiple Vitamin (MULTIVITAMIN) tablet Take 1 tablet by mouth daily.    Marland Kitchen omega-3 acid ethyl esters (LOVAZA) 1 g capsule Take 1 g by mouth daily.     Marland Kitchen omeprazole (PRILOSEC) 20 MG capsule Take 20 mg by mouth daily.    Marland Kitchen PARoxetine (PAXIL) 40 MG tablet Take 40 mg by mouth daily.  0  . pravastatin (PRAVACHOL) 20 MG tablet Take 20 mg by mouth daily.    Marland Kitchen PREMARIN vaginal cream PLACE VAGINALLY EVERY OTHER DAY FOR 30 DAYS. APPLY A SMALL AMOUNT EVERY OTHER DAY  11  . warfarin (COUMADIN) 5 MG tablet Take 5 mg by mouth. Take as directed by coumadin clinic    . acetaminophen (TYLENOL) 500 MG tablet Take 500 mg by mouth every 4 (four) hours as needed.      No current facility-administered medications for this visit.     Functional Status:  No flowsheet data found.  Fall/Depression Screening: No flowsheet data found. No flowsheet data found.  ASSESSMENT: Date Discharged from  Hospital: 09/15/2017 Date Medication Reconciliation Performed: 10/04/2017   New Medications at Discharge:   Cefdinir (short course, patient has completed)  Myrbetriq (mirabegron) (short course, patient states she is no longer taking)   Patient was recently discharged from hospital and all medications have been reviewed  Drugs sorted by system:  Neurologic/Psychologic: alprazolam, paroxetine  Cardiovascular: amlodipine, atenolol, losartan, omega-3 fatty acids, pravastatin, warfarin  Gastrointestinal: omeprazole  Pain: acetaminophen  Vitamins/Minerals: Vitamin D, magnesium oxide, multivitamin  Genitourinary: Premarin vaginal cream    Medications to avoid in the elderly:  - Alprazolam - This drug is identified in the Beers Criteria as a potentially inappropriate medication to be avoided in patients 65 years and older due to increased risk of impaired cognition, delirium, falls, fractures, and motor vehicle accidents with benzodiazepine use.   PLAN: - Reviewed medications and indications with patient. No questions/concerns identified - Will route note to PCP Jiles Harold, NP for Darlene Baxter, PharmD PGY2 Ambulatory Care Pharmacy Resident Phone: [phone]

## 2017-10-13 DIAGNOSIS — Z7901 Long term (current) use of anticoagulants: Secondary | ICD-10-CM | POA: Diagnosis not present

## 2017-10-13 DIAGNOSIS — I480 Paroxysmal atrial fibrillation: Secondary | ICD-10-CM | POA: Diagnosis not present

## 2017-10-13 DIAGNOSIS — R791 Abnormal coagulation profile: Secondary | ICD-10-CM | POA: Diagnosis not present

## 2017-10-18 DIAGNOSIS — H25012 Cortical age-related cataract, left eye: Secondary | ICD-10-CM | POA: Diagnosis not present

## 2017-10-18 DIAGNOSIS — H35341 Macular cyst, hole, or pseudohole, right eye: Secondary | ICD-10-CM | POA: Diagnosis not present

## 2017-10-18 DIAGNOSIS — H43392 Other vitreous opacities, left eye: Secondary | ICD-10-CM | POA: Diagnosis not present

## 2017-11-02 DIAGNOSIS — I480 Paroxysmal atrial fibrillation: Secondary | ICD-10-CM | POA: Diagnosis not present

## 2017-11-02 DIAGNOSIS — Z7901 Long term (current) use of anticoagulants: Secondary | ICD-10-CM | POA: Diagnosis not present

## 2017-11-11 DIAGNOSIS — Z7901 Long term (current) use of anticoagulants: Secondary | ICD-10-CM | POA: Diagnosis not present

## 2017-11-11 DIAGNOSIS — I480 Paroxysmal atrial fibrillation: Secondary | ICD-10-CM | POA: Diagnosis not present

## 2017-11-11 DIAGNOSIS — R791 Abnormal coagulation profile: Secondary | ICD-10-CM | POA: Diagnosis not present

## 2017-11-16 DIAGNOSIS — R32 Unspecified urinary incontinence: Secondary | ICD-10-CM | POA: Diagnosis not present

## 2017-11-16 DIAGNOSIS — R319 Hematuria, unspecified: Secondary | ICD-10-CM | POA: Diagnosis not present

## 2017-11-16 DIAGNOSIS — N390 Urinary tract infection, site not specified: Secondary | ICD-10-CM | POA: Diagnosis not present

## 2017-11-16 DIAGNOSIS — R310 Gross hematuria: Secondary | ICD-10-CM | POA: Diagnosis not present

## 2017-11-16 DIAGNOSIS — N8110 Cystocele, unspecified: Secondary | ICD-10-CM | POA: Diagnosis not present

## 2017-11-25 DIAGNOSIS — Z7901 Long term (current) use of anticoagulants: Secondary | ICD-10-CM | POA: Diagnosis not present

## 2017-11-25 DIAGNOSIS — I480 Paroxysmal atrial fibrillation: Secondary | ICD-10-CM | POA: Diagnosis not present

## 2017-12-14 DIAGNOSIS — I129 Hypertensive chronic kidney disease with stage 1 through stage 4 chronic kidney disease, or unspecified chronic kidney disease: Secondary | ICD-10-CM | POA: Diagnosis not present

## 2017-12-14 DIAGNOSIS — E781 Pure hyperglyceridemia: Secondary | ICD-10-CM | POA: Diagnosis not present

## 2017-12-14 DIAGNOSIS — Z1239 Encounter for other screening for malignant neoplasm of breast: Secondary | ICD-10-CM | POA: Diagnosis not present

## 2017-12-14 DIAGNOSIS — R7303 Prediabetes: Secondary | ICD-10-CM | POA: Diagnosis not present

## 2017-12-14 DIAGNOSIS — F411 Generalized anxiety disorder: Secondary | ICD-10-CM | POA: Diagnosis not present

## 2017-12-14 DIAGNOSIS — Z23 Encounter for immunization: Secondary | ICD-10-CM | POA: Diagnosis not present

## 2017-12-14 DIAGNOSIS — Z1211 Encounter for screening for malignant neoplasm of colon: Secondary | ICD-10-CM | POA: Diagnosis not present

## 2017-12-14 DIAGNOSIS — N189 Chronic kidney disease, unspecified: Secondary | ICD-10-CM | POA: Diagnosis not present

## 2017-12-14 DIAGNOSIS — Z79899 Other long term (current) drug therapy: Secondary | ICD-10-CM | POA: Diagnosis not present

## 2017-12-14 DIAGNOSIS — G4700 Insomnia, unspecified: Secondary | ICD-10-CM | POA: Diagnosis not present

## 2017-12-14 DIAGNOSIS — I480 Paroxysmal atrial fibrillation: Secondary | ICD-10-CM | POA: Diagnosis not present

## 2017-12-14 DIAGNOSIS — Z7901 Long term (current) use of anticoagulants: Secondary | ICD-10-CM | POA: Diagnosis not present

## 2017-12-20 DIAGNOSIS — I480 Paroxysmal atrial fibrillation: Secondary | ICD-10-CM | POA: Diagnosis not present

## 2017-12-20 DIAGNOSIS — Z7901 Long term (current) use of anticoagulants: Secondary | ICD-10-CM | POA: Diagnosis not present

## 2018-01-12 DIAGNOSIS — I480 Paroxysmal atrial fibrillation: Secondary | ICD-10-CM | POA: Diagnosis not present

## 2018-01-12 DIAGNOSIS — Z7901 Long term (current) use of anticoagulants: Secondary | ICD-10-CM | POA: Diagnosis not present

## 2018-01-16 DIAGNOSIS — N8110 Cystocele, unspecified: Secondary | ICD-10-CM | POA: Diagnosis not present

## 2018-01-16 DIAGNOSIS — N390 Urinary tract infection, site not specified: Secondary | ICD-10-CM | POA: Diagnosis not present

## 2018-01-16 DIAGNOSIS — R32 Unspecified urinary incontinence: Secondary | ICD-10-CM | POA: Diagnosis not present

## 2018-01-16 DIAGNOSIS — R319 Hematuria, unspecified: Secondary | ICD-10-CM | POA: Diagnosis not present

## 2018-01-26 DIAGNOSIS — I480 Paroxysmal atrial fibrillation: Secondary | ICD-10-CM | POA: Diagnosis not present

## 2018-01-26 DIAGNOSIS — Z7901 Long term (current) use of anticoagulants: Secondary | ICD-10-CM | POA: Diagnosis not present

## 2018-02-23 DIAGNOSIS — Z7901 Long term (current) use of anticoagulants: Secondary | ICD-10-CM | POA: Diagnosis not present

## 2018-02-23 DIAGNOSIS — I480 Paroxysmal atrial fibrillation: Secondary | ICD-10-CM | POA: Diagnosis not present

## 2018-03-24 DIAGNOSIS — F5101 Primary insomnia: Secondary | ICD-10-CM | POA: Diagnosis not present

## 2018-03-24 DIAGNOSIS — N189 Chronic kidney disease, unspecified: Secondary | ICD-10-CM | POA: Diagnosis not present

## 2018-03-24 DIAGNOSIS — R8290 Unspecified abnormal findings in urine: Secondary | ICD-10-CM | POA: Diagnosis not present

## 2018-03-24 DIAGNOSIS — R7303 Prediabetes: Secondary | ICD-10-CM | POA: Diagnosis not present

## 2018-03-24 DIAGNOSIS — M7522 Bicipital tendinitis, left shoulder: Secondary | ICD-10-CM | POA: Diagnosis not present

## 2018-03-24 DIAGNOSIS — E781 Pure hyperglyceridemia: Secondary | ICD-10-CM | POA: Diagnosis not present

## 2018-03-24 DIAGNOSIS — I129 Hypertensive chronic kidney disease with stage 1 through stage 4 chronic kidney disease, or unspecified chronic kidney disease: Secondary | ICD-10-CM | POA: Diagnosis not present

## 2018-03-24 DIAGNOSIS — Z1239 Encounter for other screening for malignant neoplasm of breast: Secondary | ICD-10-CM | POA: Diagnosis not present

## 2018-03-24 DIAGNOSIS — F411 Generalized anxiety disorder: Secondary | ICD-10-CM | POA: Diagnosis not present

## 2018-03-24 DIAGNOSIS — I480 Paroxysmal atrial fibrillation: Secondary | ICD-10-CM | POA: Diagnosis not present

## 2018-03-30 DIAGNOSIS — R300 Dysuria: Secondary | ICD-10-CM | POA: Diagnosis not present

## 2018-04-03 DIAGNOSIS — Z1231 Encounter for screening mammogram for malignant neoplasm of breast: Secondary | ICD-10-CM | POA: Diagnosis not present

## 2018-04-04 DIAGNOSIS — Z5181 Encounter for therapeutic drug level monitoring: Secondary | ICD-10-CM | POA: Diagnosis not present

## 2018-04-04 DIAGNOSIS — Z1231 Encounter for screening mammogram for malignant neoplasm of breast: Secondary | ICD-10-CM | POA: Diagnosis not present

## 2018-04-04 DIAGNOSIS — Z7901 Long term (current) use of anticoagulants: Secondary | ICD-10-CM | POA: Diagnosis not present

## 2018-04-04 DIAGNOSIS — Z1239 Encounter for other screening for malignant neoplasm of breast: Secondary | ICD-10-CM | POA: Diagnosis not present

## 2018-04-04 DIAGNOSIS — I480 Paroxysmal atrial fibrillation: Secondary | ICD-10-CM | POA: Diagnosis not present

## 2018-04-05 DIAGNOSIS — H25813 Combined forms of age-related cataract, bilateral: Secondary | ICD-10-CM | POA: Diagnosis not present

## 2018-04-05 DIAGNOSIS — H43392 Other vitreous opacities, left eye: Secondary | ICD-10-CM | POA: Diagnosis not present

## 2018-04-05 DIAGNOSIS — H35371 Puckering of macula, right eye: Secondary | ICD-10-CM | POA: Diagnosis not present

## 2018-04-11 DIAGNOSIS — Z7901 Long term (current) use of anticoagulants: Secondary | ICD-10-CM | POA: Diagnosis not present

## 2018-04-11 DIAGNOSIS — I480 Paroxysmal atrial fibrillation: Secondary | ICD-10-CM | POA: Diagnosis not present

## 2018-04-11 DIAGNOSIS — Z5181 Encounter for therapeutic drug level monitoring: Secondary | ICD-10-CM | POA: Diagnosis not present

## 2018-05-03 DIAGNOSIS — L97822 Non-pressure chronic ulcer of other part of left lower leg with fat layer exposed: Secondary | ICD-10-CM | POA: Diagnosis not present

## 2018-05-03 DIAGNOSIS — J449 Chronic obstructive pulmonary disease, unspecified: Secondary | ICD-10-CM | POA: Diagnosis not present

## 2018-05-03 DIAGNOSIS — I87311 Chronic venous hypertension (idiopathic) with ulcer of right lower extremity: Secondary | ICD-10-CM | POA: Diagnosis not present

## 2018-05-03 DIAGNOSIS — R791 Abnormal coagulation profile: Secondary | ICD-10-CM | POA: Diagnosis not present

## 2018-05-03 DIAGNOSIS — I480 Paroxysmal atrial fibrillation: Secondary | ICD-10-CM | POA: Diagnosis not present

## 2018-05-03 DIAGNOSIS — L97812 Non-pressure chronic ulcer of other part of right lower leg with fat layer exposed: Secondary | ICD-10-CM | POA: Diagnosis not present

## 2018-05-03 DIAGNOSIS — Z7901 Long term (current) use of anticoagulants: Secondary | ICD-10-CM | POA: Diagnosis not present

## 2018-05-03 DIAGNOSIS — I10 Essential (primary) hypertension: Secondary | ICD-10-CM | POA: Diagnosis not present

## 2018-05-15 DIAGNOSIS — R300 Dysuria: Secondary | ICD-10-CM | POA: Diagnosis not present

## 2018-05-15 DIAGNOSIS — N8110 Cystocele, unspecified: Secondary | ICD-10-CM | POA: Diagnosis not present

## 2018-05-17 DIAGNOSIS — R791 Abnormal coagulation profile: Secondary | ICD-10-CM | POA: Diagnosis not present

## 2018-05-17 DIAGNOSIS — Z7901 Long term (current) use of anticoagulants: Secondary | ICD-10-CM | POA: Diagnosis not present

## 2018-05-17 DIAGNOSIS — I480 Paroxysmal atrial fibrillation: Secondary | ICD-10-CM | POA: Diagnosis not present

## 2018-07-10 DIAGNOSIS — Z7901 Long term (current) use of anticoagulants: Secondary | ICD-10-CM | POA: Diagnosis not present

## 2018-07-10 DIAGNOSIS — I480 Paroxysmal atrial fibrillation: Secondary | ICD-10-CM | POA: Diagnosis not present

## 2018-08-07 DIAGNOSIS — I480 Paroxysmal atrial fibrillation: Secondary | ICD-10-CM | POA: Diagnosis not present

## 2018-08-07 DIAGNOSIS — Z7901 Long term (current) use of anticoagulants: Secondary | ICD-10-CM | POA: Diagnosis not present

## 2018-09-04 DIAGNOSIS — I480 Paroxysmal atrial fibrillation: Secondary | ICD-10-CM | POA: Diagnosis not present

## 2018-09-04 DIAGNOSIS — Z7901 Long term (current) use of anticoagulants: Secondary | ICD-10-CM | POA: Diagnosis not present

## 2018-09-20 DIAGNOSIS — R300 Dysuria: Secondary | ICD-10-CM | POA: Diagnosis not present

## 2018-09-20 DIAGNOSIS — N8110 Cystocele, unspecified: Secondary | ICD-10-CM | POA: Diagnosis not present

## 2018-09-27 DIAGNOSIS — I129 Hypertensive chronic kidney disease with stage 1 through stage 4 chronic kidney disease, or unspecified chronic kidney disease: Secondary | ICD-10-CM | POA: Diagnosis not present

## 2018-09-27 DIAGNOSIS — I10 Essential (primary) hypertension: Secondary | ICD-10-CM | POA: Diagnosis not present

## 2018-09-27 DIAGNOSIS — F5101 Primary insomnia: Secondary | ICD-10-CM | POA: Diagnosis not present

## 2018-09-27 DIAGNOSIS — I480 Paroxysmal atrial fibrillation: Secondary | ICD-10-CM | POA: Diagnosis not present

## 2018-09-27 DIAGNOSIS — N189 Chronic kidney disease, unspecified: Secondary | ICD-10-CM | POA: Diagnosis not present

## 2018-09-27 DIAGNOSIS — E781 Pure hyperglyceridemia: Secondary | ICD-10-CM | POA: Diagnosis not present

## 2018-09-27 DIAGNOSIS — R7303 Prediabetes: Secondary | ICD-10-CM | POA: Diagnosis not present

## 2018-09-27 DIAGNOSIS — F411 Generalized anxiety disorder: Secondary | ICD-10-CM | POA: Diagnosis not present

## 2018-10-16 DIAGNOSIS — I480 Paroxysmal atrial fibrillation: Secondary | ICD-10-CM | POA: Diagnosis not present

## 2018-10-16 DIAGNOSIS — Z7901 Long term (current) use of anticoagulants: Secondary | ICD-10-CM | POA: Diagnosis not present

## 2018-11-10 DIAGNOSIS — L578 Other skin changes due to chronic exposure to nonionizing radiation: Secondary | ICD-10-CM | POA: Diagnosis not present

## 2018-11-10 DIAGNOSIS — C44612 Basal cell carcinoma of skin of right upper limb, including shoulder: Secondary | ICD-10-CM | POA: Diagnosis not present

## 2018-11-20 DIAGNOSIS — C44612 Basal cell carcinoma of skin of right upper limb, including shoulder: Secondary | ICD-10-CM | POA: Diagnosis not present

## 2018-11-27 DIAGNOSIS — Z7901 Long term (current) use of anticoagulants: Secondary | ICD-10-CM | POA: Diagnosis not present

## 2018-11-27 DIAGNOSIS — Z23 Encounter for immunization: Secondary | ICD-10-CM | POA: Diagnosis not present

## 2018-11-27 DIAGNOSIS — I480 Paroxysmal atrial fibrillation: Secondary | ICD-10-CM | POA: Diagnosis not present

## 2019-01-04 ENCOUNTER — Other Ambulatory Visit: Payer: Self-pay | Admitting: *Deleted

## 2019-01-04 NOTE — Patient Outreach (Signed)
[facility]) Care Management  01/04/2019  Darlene Baxter 12-15-1945 [id number]    Case reviewed, no patient outreach needed,  and case closed per Bary Castilla, Assistant Clinical Director at [facility]  request.   [name]. Annia Friendly, BSN, [place] [facility] Telephonic CM Phone: [phone] Fax: [phone]

## 2019-01-08 DIAGNOSIS — I480 Paroxysmal atrial fibrillation: Secondary | ICD-10-CM | POA: Diagnosis not present

## 2019-01-08 DIAGNOSIS — Z7901 Long term (current) use of anticoagulants: Secondary | ICD-10-CM | POA: Diagnosis not present
